# Patient Record
Sex: Female | Born: 1970 | Hispanic: Yes | Marital: Married | Smoking: Never smoker
Health system: Southern US, Community
[De-identification: ages and names within clinical notes are randomized; demographics above are authoritative.]

---

## 1998-12-23 HISTORY — PX: BREAST BIOPSY: SHX20

## 2003-12-24 HISTORY — PX: BREAST BIOPSY: SHX20

## 2004-11-07 ENCOUNTER — Ambulatory Visit: Payer: Self-pay | Admitting: General Surgery

## 2005-05-15 ENCOUNTER — Ambulatory Visit: Payer: Self-pay | Admitting: Gastroenterology

## 2006-06-05 ENCOUNTER — Ambulatory Visit: Payer: Self-pay

## 2007-02-27 ENCOUNTER — Ambulatory Visit: Payer: Self-pay | Admitting: General Surgery

## 2007-03-12 ENCOUNTER — Ambulatory Visit: Payer: Self-pay | Admitting: General Surgery

## 2007-04-17 ENCOUNTER — Ambulatory Visit: Payer: Self-pay | Admitting: General Surgery

## 2008-02-05 ENCOUNTER — Ambulatory Visit: Payer: Self-pay | Admitting: General Surgery

## 2010-12-07 ENCOUNTER — Ambulatory Visit: Payer: Self-pay | Admitting: General Surgery

## 2011-12-10 ENCOUNTER — Ambulatory Visit: Payer: Self-pay

## 2012-12-30 ENCOUNTER — Ambulatory Visit: Payer: Self-pay

## 2013-04-23 ENCOUNTER — Ambulatory Visit: Payer: Self-pay

## 2014-11-30 ENCOUNTER — Emergency Department: Payer: Self-pay | Admitting: Emergency Medicine

## 2015-03-27 ENCOUNTER — Ambulatory Visit
Admit: 2015-03-27 | Disposition: A | Discharge status: Home / Self Care | Payer: Self-pay | Attending: Urgent Care | Admitting: Urgent Care

## 2016-03-20 ENCOUNTER — Ambulatory Visit
Admission: RE | Admit: 2016-03-20 | Discharge: 2016-03-20 | Disposition: A | Discharge status: Home / Self Care | Payer: Self-pay | Source: Ambulatory Visit | Attending: Oncology | Admitting: Oncology

## 2016-03-20 ENCOUNTER — Ambulatory Visit: Payer: Self-pay | Attending: Oncology

## 2016-03-20 VITALS — BP 132/81 | HR 65 | Temp 97.2°F | Ht 63.39 in | Wt 147.9 lb

## 2016-03-20 DIAGNOSIS — Z0000 Encounter for general adult medical examination without abnormal findings: Secondary | ICD-10-CM

## 2016-03-20 NOTE — Progress Notes (Signed)
Subjective:     Patient ID: Ane PaymentMaria E Zinni, female   DOB: Mar 05, 1971, 45 y.o.   MRN: [id number]  HPI   Review of Systems     Objective:   Physical Exam  Pulmonary/Chest: Right breast exhibits no inverted nipple, no mass, no nipple discharge, no skin change and no tenderness. Left breast exhibits no inverted nipple, no mass, no nipple discharge, no skin change and no tenderness. Breasts are asymmetrical.    History of benign right breast biopsy       Assessment:     45 [facility]  Patient screened, and meets [facility] eligibility.  Patient does not have insurance, Medicare or Medicaid.  Handout given on Affordable Care Act. Instructed patient on breast self-exam using teach back method. CBE unremarkable.  Left breast 1/2 cup size larger than right as noted on previous exams. Patient reports irregular menstrual cycles, hot flashes, and intermittent bilateral breast tenderness, greater on left side.  Has historically had left breast tenderness, with normal mammograms, and ultrasounds. No mass or lump palpated. Given literature on Menopause, and Help for Breast Pain.  Loyda Murr interpreted exam.    Plan:    Previous mammogram showed homogenously dense breast tissue. Sent for screening bilateral tomosynthesis  mammogram.

## 2016-03-21 NOTE — Progress Notes (Signed)
Letter mailed from [facility] to notify of normal mammogram results.  Patient to return in one year for annual screening.  Copy to [facility]. 

## 2017-03-19 ENCOUNTER — Other Ambulatory Visit: Payer: Self-pay | Admitting: Family Medicine

## 2017-03-19 DIAGNOSIS — Z0000 Encounter for general adult medical examination without abnormal findings: Secondary | ICD-10-CM

## 2017-04-18 ENCOUNTER — Ambulatory Visit
Admission: RE | Admit: 2017-04-18 | Discharge: 2017-04-18 | Disposition: A | Discharge status: Home / Self Care | Payer: BLUE CROSS/BLUE SHIELD | Source: Ambulatory Visit | Attending: Family Medicine | Admitting: Family Medicine

## 2017-04-18 DIAGNOSIS — Z1231 Encounter for screening mammogram for malignant neoplasm of breast: Secondary | ICD-10-CM | POA: Diagnosis not present

## 2017-04-18 DIAGNOSIS — Z0000 Encounter for general adult medical examination without abnormal findings: Secondary | ICD-10-CM

## 2018-06-11 ENCOUNTER — Other Ambulatory Visit: Payer: Self-pay | Admitting: Family Medicine

## 2018-06-11 DIAGNOSIS — Z1231 Encounter for screening mammogram for malignant neoplasm of breast: Secondary | ICD-10-CM

## 2018-07-01 ENCOUNTER — Ambulatory Visit
Admission: RE | Admit: 2018-07-01 | Discharge: 2018-07-01 | Disposition: A | Discharge status: Home / Self Care | Payer: BLUE CROSS/BLUE SHIELD | Source: Ambulatory Visit | Attending: Family Medicine | Admitting: Family Medicine

## 2018-07-01 DIAGNOSIS — Z1231 Encounter for screening mammogram for malignant neoplasm of breast: Secondary | ICD-10-CM | POA: Insufficient documentation

## 2019-09-30 ENCOUNTER — Ambulatory Visit
Admission: RE | Admit: 2019-09-30 | Discharge: 2019-09-30 | Disposition: A | Discharge status: Home / Self Care | Payer: BLUE CROSS/BLUE SHIELD | Source: Ambulatory Visit | Attending: Otolaryngology | Admitting: Otolaryngology

## 2019-09-30 ENCOUNTER — Other Ambulatory Visit (HOSPITAL_COMMUNITY): Payer: Self-pay | Admitting: Otolaryngology

## 2019-09-30 ENCOUNTER — Other Ambulatory Visit: Payer: Self-pay

## 2019-09-30 ENCOUNTER — Other Ambulatory Visit: Payer: Self-pay | Admitting: Otolaryngology

## 2019-09-30 DIAGNOSIS — R1310 Dysphagia, unspecified: Secondary | ICD-10-CM | POA: Diagnosis present

## 2019-09-30 MED ORDER — IOHEXOL 300 MG/ML  SOLN
75.0000 mL | Freq: Once | INTRAMUSCULAR | Status: AC | PRN
  Administered 2019-09-30: 75 mL via INTRAVENOUS

## 2019-10-04 ENCOUNTER — Other Ambulatory Visit: Payer: Self-pay | Admitting: Otolaryngology

## 2019-10-04 DIAGNOSIS — R070 Pain in throat: Secondary | ICD-10-CM

## 2019-10-04 DIAGNOSIS — R1310 Dysphagia, unspecified: Secondary | ICD-10-CM

## 2019-10-27 ENCOUNTER — Other Ambulatory Visit: Payer: Self-pay | Admitting: Family Medicine

## 2019-10-27 DIAGNOSIS — Z1231 Encounter for screening mammogram for malignant neoplasm of breast: Secondary | ICD-10-CM

## 2019-11-04 ENCOUNTER — Ambulatory Visit
Admission: RE | Admit: 2019-11-04 | Discharge: 2019-11-04 | Disposition: A | Discharge status: Home / Self Care | Payer: BLUE CROSS/BLUE SHIELD | Source: Ambulatory Visit | Attending: Otolaryngology | Admitting: Otolaryngology

## 2019-11-04 ENCOUNTER — Other Ambulatory Visit: Payer: Self-pay

## 2019-11-04 DIAGNOSIS — R070 Pain in throat: Secondary | ICD-10-CM | POA: Insufficient documentation

## 2019-11-04 DIAGNOSIS — R1310 Dysphagia, unspecified: Secondary | ICD-10-CM | POA: Insufficient documentation

## 2019-11-04 MED ORDER — IOHEXOL 300 MG/ML  SOLN
75.0000 mL | Freq: Once | INTRAMUSCULAR | Status: AC | PRN
  Administered 2019-11-04: 75 mL via INTRAVENOUS

## 2020-12-18 ENCOUNTER — Other Ambulatory Visit: Payer: BLUE CROSS/BLUE SHIELD

## 2020-12-18 DIAGNOSIS — Z20822 Contact with and (suspected) exposure to covid-19: Secondary | ICD-10-CM

## 2020-12-20 LAB — NOVEL CORONAVIRUS, NAA: SARS-CoV-2, NAA: NOT DETECTED

## 2020-12-20 LAB — SARS-COV-2, NAA 2 DAY TAT

## 2021-02-08 IMAGING — CT CT NECK W/ CM
4 of 5 series · 13 of 33 positions shown, 15 images · IV contrast (omnipaque)
Comparison: Neck CT 09/30/2019

CLINICAL DATA: Throat pain. Dysphagia, unspecified type. Additional
history provided: Itching, ear pain in both ears and sore throat for
past 3 months. Patient reportedly

EXAM:
CT NECK WITH CONTRAST
TECHNIQUE: Multidetector CT imaging of the neck was performed using the
standard protocol following the bolus administration of intravenous
contrast.
CONTRAST:  75mL OMNIPAQUE IOHEXOL 300 MG/ML  SOLN

[Series 2: axial neck neck (person_name) 2.00 · axial · 0.60mm/px · z∈[-701,-639]mm · 2 of 124 slices shown]
[im 31/124  bone]
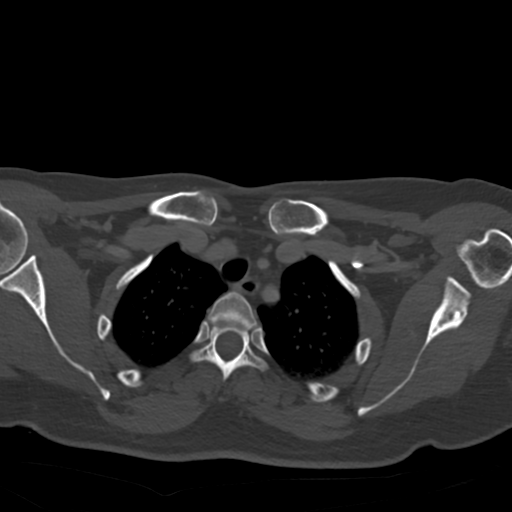
[im 62/124  bone]
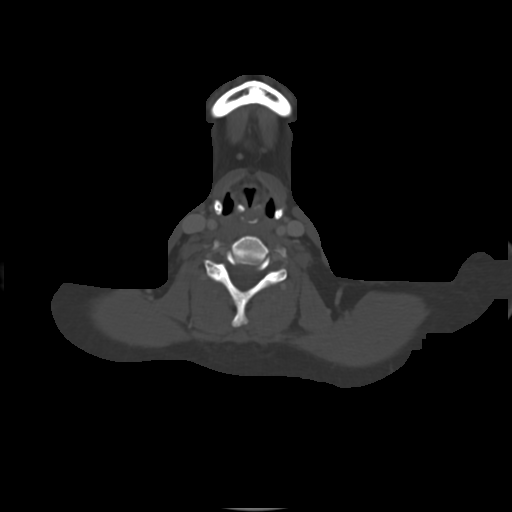

[Series 4: coronal neck neck (person_name) 2.00 cor · coronal · 0.41mm/px · 3 of 141 slices shown]
[im 45/141  bone]
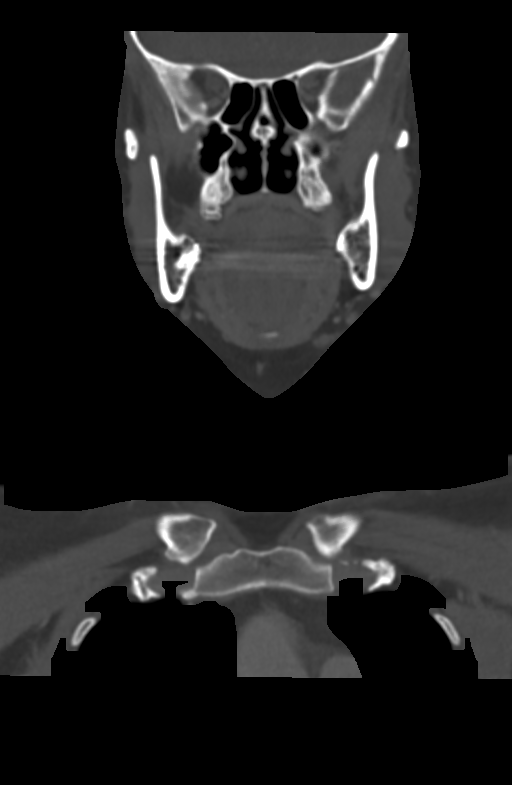
[im 62/141  bone]
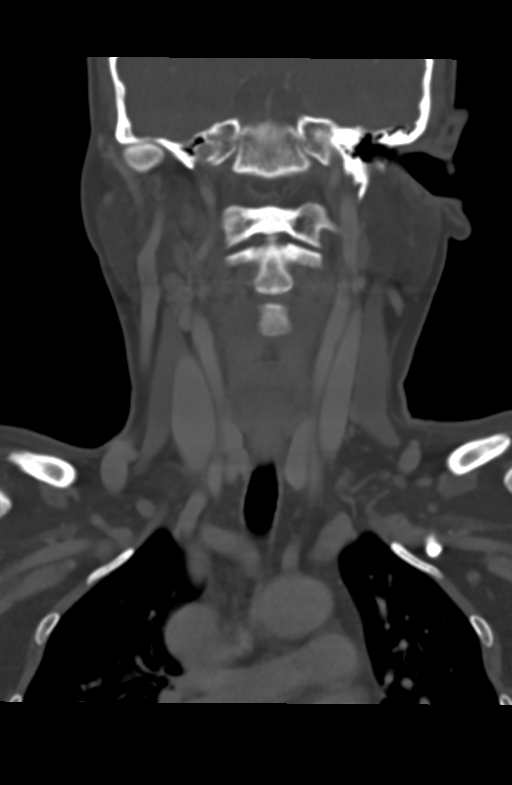
[im 79/141  bone]
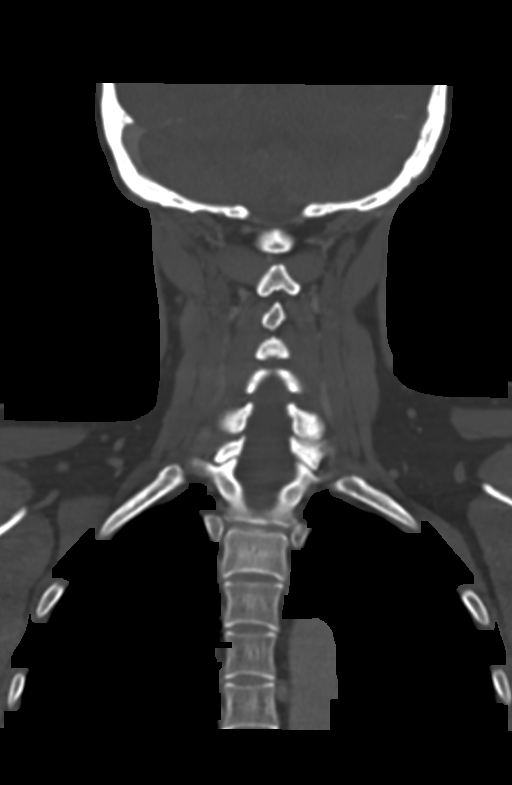

[Series 6: sagittal neck neck (person_name) 2.00 sag · sagittal · 0.55mm/px · 5 of 103 slices shown, 6 images]
[im 35/103  bone]
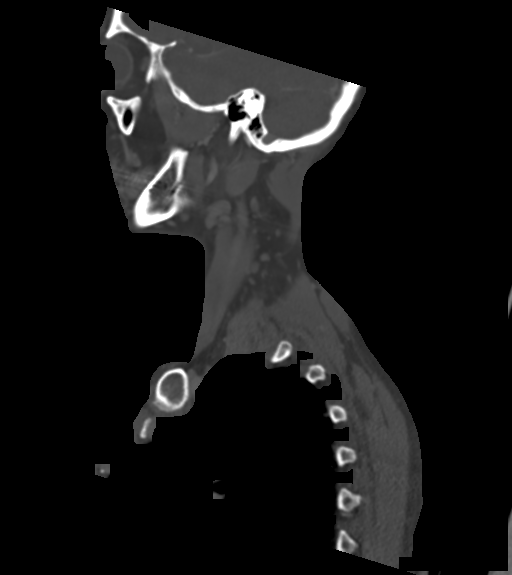
[im 43/103  bone]
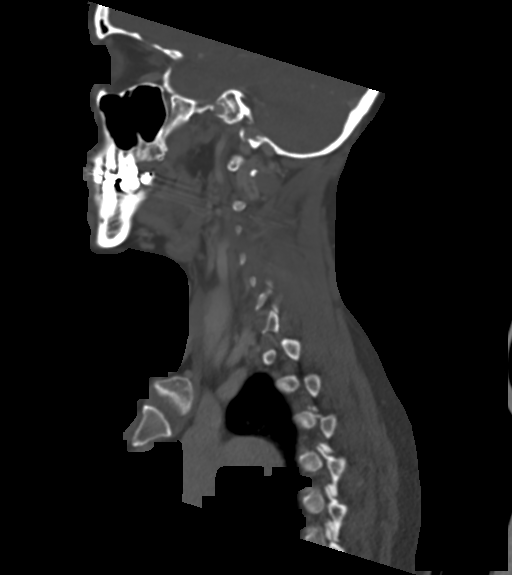
[im 52/103  soft-tissue]
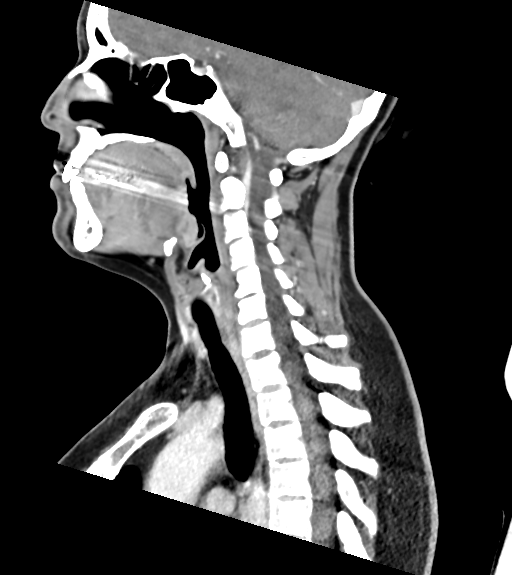
[im 52/103  bone]
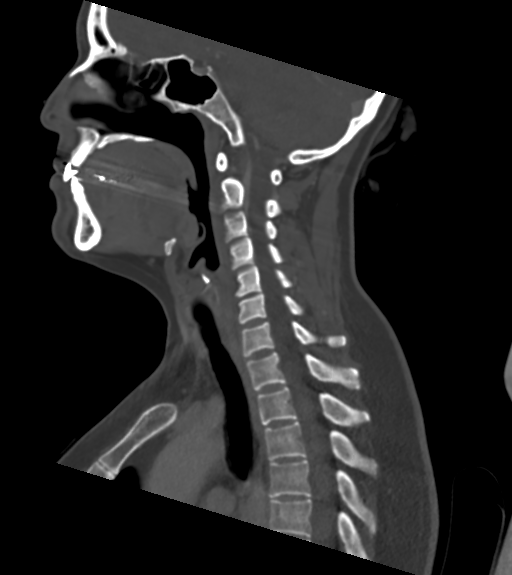
[im 60/103  bone]
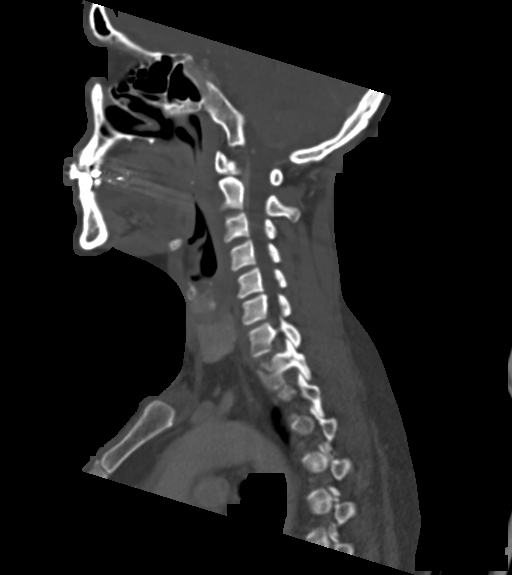
[im 69/103  bone]
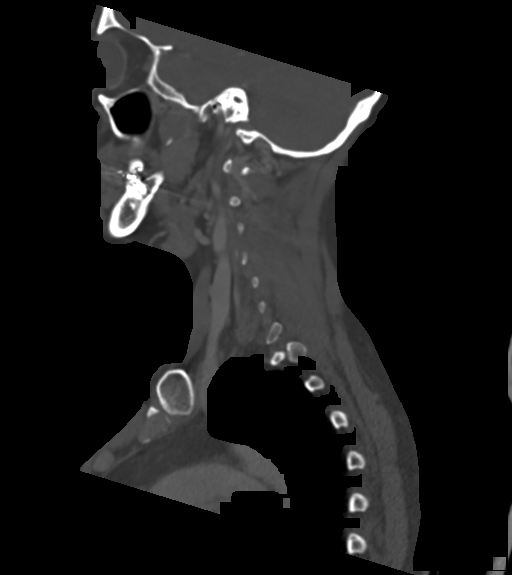

[Series 8: ax oropharynx neck neck (person_name) 2.00 ax · axial · 0.41mm/px · z∈[-756,-605]mm · 3 of 159 slices shown, 4 images]
[im 40/159  soft-tissue]
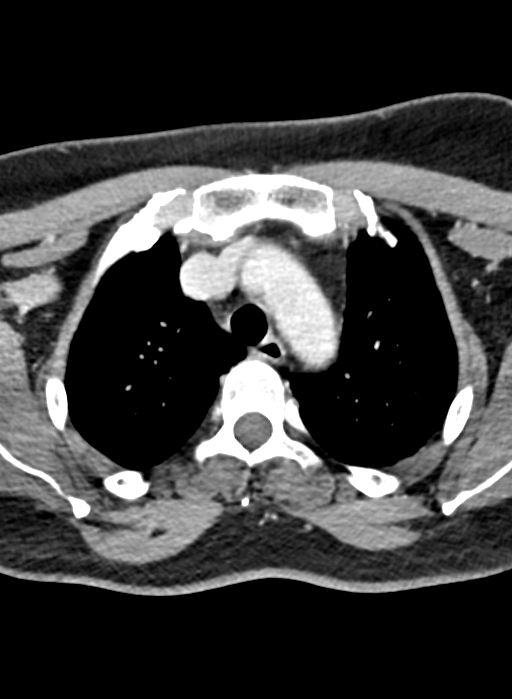
[im 40/159  bone]
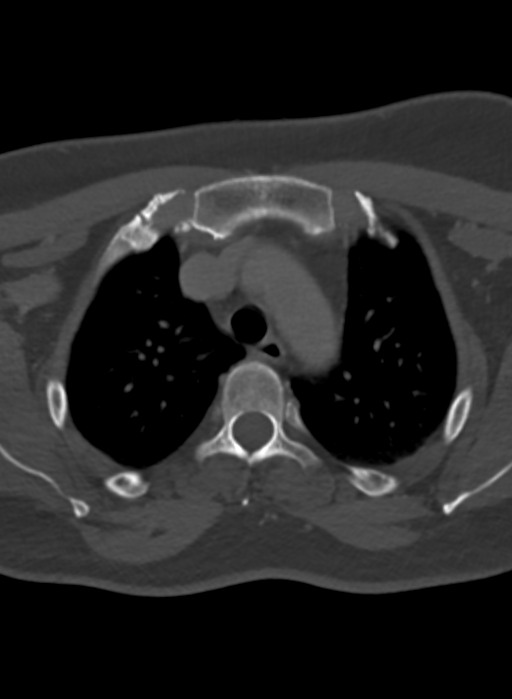
[im 80/159  bone]
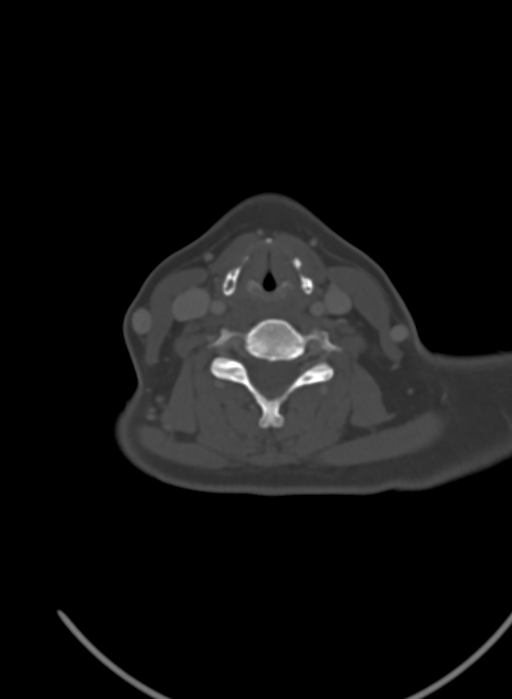
[im 119/159  bone]
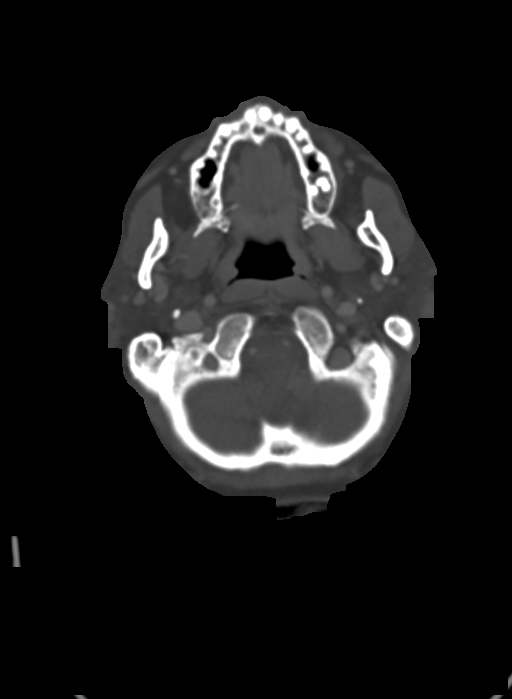

[13 of 33 positions shown; findings below may reference images not displayed]

FINDINGS: Pharynx and larynx: No appreciable swelling or discrete mass within
the oral cavity, pharynx or larynx. Redemonstrated postinflammatory
bilateral tonsillar calcifications.

Salivary glands: No evidence of inflammation, mass or stone.

Thyroid: Negative

Lymph nodes: No pathologically enlarged cervical chain lymph nodes.

Vascular: The major vascular structures of the neck appear patent.

Limited intracranial: No abnormality identified

Visualized orbits: Visualized orbits demonstrate no acute
abnormality.

Mastoids and visualized paranasal sinuses: No significant paranasal
sinus disease or mastoid effusion at the imaged levels.

Skeleton: No acute bony abnormality or suspicious osseous lesion.

Upper chest: No consolidation within the imaged lung apices.
Redemonstrated prominent azygos venous arch.

Other: Again demonstrated are two subcentimeter cystic appearing
foci above and below the hyoid bone (series 6, image 55). However,
the previously demonstrated peripheral enhancement and subtle
surrounding inflammatory changes are no longer present.
IMPRESSION: Redemonstrated are two subcentimeter cystic appearing foci above and
below the hyoid bone, likely reflecting a bilobed thyroglossal duct
cyst. Previously demonstrated peripheral enhancement and subtle
surrounding inflammatory changes have resolved, consistent with
interval resolution of superinfection.

## 2021-05-14 ENCOUNTER — Other Ambulatory Visit: Payer: Self-pay | Admitting: Family Medicine

## 2021-05-14 DIAGNOSIS — Z1231 Encounter for screening mammogram for malignant neoplasm of breast: Secondary | ICD-10-CM

## 2023-06-24 ENCOUNTER — Other Ambulatory Visit: Payer: Self-pay | Admitting: Family Medicine

## 2023-06-24 DIAGNOSIS — Z1231 Encounter for screening mammogram for malignant neoplasm of breast: Secondary | ICD-10-CM

## 2024-09-27 ENCOUNTER — Other Ambulatory Visit: Payer: Self-pay | Admitting: Family Medicine

## 2024-09-27 DIAGNOSIS — Z1231 Encounter for screening mammogram for malignant neoplasm of breast: Secondary | ICD-10-CM

## 2024-11-08 ENCOUNTER — Ambulatory Visit
Admission: RE | Admit: 2024-11-08 | Discharge: 2024-11-08 | Disposition: A | Discharge status: Home / Self Care | Source: Ambulatory Visit | Attending: Family Medicine | Admitting: Family Medicine

## 2024-11-08 DIAGNOSIS — Z1231 Encounter for screening mammogram for malignant neoplasm of breast: Secondary | ICD-10-CM | POA: Diagnosis present

## 2024-11-09 ENCOUNTER — Inpatient Hospital Stay
Admission: RE | Admit: 2024-11-09 | Discharge: 2024-11-09 | Disposition: A | Payer: Self-pay | Source: Ambulatory Visit | Attending: Family Medicine | Admitting: Family Medicine

## 2024-11-09 ENCOUNTER — Other Ambulatory Visit: Payer: Self-pay | Admitting: *Deleted

## 2024-11-09 ENCOUNTER — Inpatient Hospital Stay
Admission: RE | Admit: 2024-11-09 | Discharge: 2024-11-09 | Disposition: A | Discharge status: Home / Self Care | Payer: Self-pay | Source: Ambulatory Visit | Attending: Family Medicine | Admitting: Family Medicine

## 2024-11-09 DIAGNOSIS — Z1231 Encounter for screening mammogram for malignant neoplasm of breast: Secondary | ICD-10-CM

## 2024-12-14 NOTE — Progress Notes (Deleted)
 Sleep Medicine   Office Visit  Patient Name: Jane Pitts DOB: 1971/03/18 MRN [id number]    Chief Complaint: ***  Brief History:  Lawren presents for an initial consult for sleep evaluation and to establish care. Patient has a *** history of ***. Sleep quality is ***. This is noted *** nights. The patient's bed partner reports *** at night. The patient relates the following symptoms: *** are also present. The patient goes to sleep at *** and wakes up at ***. Patient reports waking up at least *** times in between and has *** trouble returning to sleep. Sleep quality is *** when outside home environment.  Patient has noted *** of her legs at night that would disrupt her sleep.  The patient reports *** as unusual behavior during the night.  The patient reports *** as a history of psychiatric problems. The Epworth Sleepiness Score is *** out of 24.  The patient reports cardiovascular risk factors as following: ***. The patient reports ***.    ROS  General: (-) fever, (-) chills, (-) night sweat Nose and Sinuses: (-) nasal stuffiness or itchiness, (-) postnasal drip, (-) nosebleeds, (-) sinus trouble. Mouth and Throat: (-) sore throat, (-) hoarseness. Neck: (-) swollen glands, (-) enlarged thyroid, (-) neck pain. Respiratory: *** cough, *** shortness of breath, *** wheezing. Neurologic: *** numbness, *** tingling. Psychiatric: *** anxiety, *** depression Sleep behavior: ***sleep paralysis ***hypnogogic hallucinations ***dream enactment      ***vivid dreams ***cataplexy ***night terrors ***sleep walking   Current Medication: No outpatient encounter medications on file as of 12/20/2024.   No facility-administered encounter medications on file as of 12/20/2024.    Surgical History: Past Surgical History:  Procedure Laterality Date   BREAST BIOPSY Left 2005   BREAST BIOPSY Right 2000    Medical History: No past medical history on file.  Family History: Non contributory to the  present illness  Social History: Social History   Socioeconomic History   Marital status: Married    Spouse name: Not on file   Number of children: Not on file   Years of education: Not on file   Highest education level: Not on file  Occupational History   Not on file  Tobacco Use   Smoking status: Never   Smokeless tobacco: Not on file  Substance and Sexual Activity   Alcohol use: Not on file   Drug use: Not on file   Sexual activity: Not on file  Other Topics Concern   Not on file  Social History Narrative   Not on file   Social Drivers of Health   Tobacco Use: Low Risk  (11/26/2024)   Received from [facility]   Patient History    Smoking Tobacco Use: Never    Smokeless Tobacco Use: Never    Passive Exposure: Never  Financial Resource Strain: Not on file  Food Insecurity: Not on file  Transportation Needs: Not on file  Physical Activity: Not on file  Stress: Not on file  Social Connections: Not on file  Intimate Partner Violence: Not on file  Depression (EYV7-0): Not on file  Alcohol Screen: Not on file  Housing: Not on file  Utilities: Not on file  Health Literacy: Not on file    Vital Signs: There were no vitals taken for this visit. There is no height or weight on file to calculate BMI.   Examination: General Appearance: The patient is well-developed, well-nourished, and in no distress. Neck Circumference: *** Skin: Gross inspection of skin unremarkable. Head:  normocephalic, no gross deformities. Eyes: no gross deformities noted. ENT: ears appear grossly normal Neurologic: Alert and oriented. No involuntary movements.    STOP BANG RISK ASSESSMENT S (snore) Have you been told that you snore?     YES/N   T (tired) Are you often tired, fatigued, or sleepy during the day?   YES/NO  O (obstruction) Do you stop breathing, choke, or gasp during sleep? YES/NO   P (pressure) Do you have or are you being treated for high blood pressure? YES/NO   B (BMI)  Is your body index greater than 35 kg/m? YES/NO   A (age) Are you 36 years old or older? YES   N (neck) Do you have a neck circumference greater than 16 inches?   YES/NO   G (gender) Are you a female? NO   TOTAL STOP/BANG YES ANSWERS                                                                A STOP-Bang score of 2 or less is considered low risk, and a score of 5 or more is high risk for having either moderate or severe OSA. For people who score 3 or 4, doctors may need to perform further assessment to determine how likely they are to have OSA.         EPWORTH SLEEPINESS SCALE:  Scale:  (0)= no chance of dozing; (1)= slight chance of dozing; (2)= moderate chance of dozing; (3)= high chance of dozing  Chance  Situtation    Sitting and reading: ***    Watching TV: ***    Sitting Inactive in public: ***    As a passenger in car: ***      Lying down to rest: ***    Sitting and talking: ***    Sitting quielty after lunch: ***    In a car, stopped in traffic: ***   TOTAL SCORE:   *** out of 24    SLEEP STUDIES:  None   LABS: No results found for this or any previous visit (from the past 2160 hours).  Radiology: [facility] Mammo Result Date: 11/09/2024 This examination belongs to an outside facility and is stored here for comparison purposes only.  Contact the originating outside institution for any associated report or interpretation.  [facility] Mammo Result Date: 11/09/2024 This examination belongs to an outside facility and is stored here for comparison purposes only.  Contact the originating outside institution for any associated report or interpretation.  MM Outside Films Mammo Result Date: 11/09/2024 This examination belongs to an outside facility and is stored here for comparison purposes only.  Contact the originating outside institution for any associated report or interpretation.  MM Outside Films Mammo Result Date: 11/09/2024 This  examination belongs to an outside facility and is stored here for comparison purposes only.  Contact the originating outside institution for any associated report or interpretation.   No results found.  No results found.    Assessment and Plan: There are no active problems to display for this patient.    PLAN OSA:   Patient evaluation suggests high risk of sleep disordered breathing due to *** Patient has comorbid cardiovascular risk factors including: *** which could be exacerbated by pathologic sleep-disordered breathing.  Suggest: *** to assess/treat the patient's sleep disordered breathing. The patient was also counselled on *** to optimize sleep health.  PLAN hypersomnia:  Patient evaluation suggests significant daytime hypersomnia.  The Epworth Sleepiness Score is elevated at *** out of 24. Patient *** drowsy driving. The patient *** MVA due to sleepiness.  The patient *** restless leg symptoms which exacerbate *** for *** nights per week. The patient *** periodic limb movements which exacerbate ***  for *** nights per week. Suggest: ***  Also suggest ***  PLAN insomnia:  Patient evaluation suggests *** insomnia. This is a chronic disorder. This has been a concern for *** and causes impaired daytime functioning. The patient exhibits comorbid ***  The history *** suggest the insomnia predates the use of hypnotic medications. The symptoms *** with the discontinuation of these medications. There is no obvious medical, psychiatric or pharmacologic abuse issues ot account for the insomnia.  Treatment recommendations include: *** The patient should maintain a sleep log and calculate total sleep time for 1-2 weeks. Set bed and wake times for achieve 85% sleep efficiency for one week. Once this is achieved  time in bed can be gradually increased. A pharmacologic treatment approach would include a trial of *** for the next ***  months. During this time the patient is to maintain a sleep  diary to track progress.    ***  General Counseling: I have discussed the findings of the evaluation and examination with Hadassah.  I have also discussed any further diagnostic evaluation thatmay be needed or ordered today. Rochanda verbalizes understanding of the findings of todays visit. We also reviewed her medications today and discussed drug interactions and side effects including but not limited excessive drowsiness and altered mental states. We also discussed that there is always a risk not just to her but also people around her. she has been encouraged to call the office with any questions or concerns that should arise related to todays visit.  No orders of the defined types were placed in this encounter.       I have personally obtained a history, evaluated the patient, evaluated pertinent data, formulated the assessment and plan and placed orders.    Elfreda DELENA Bathe, MD [facility] Diplomate ABMS Pulmonary and Critical Care Medicine Sleep medicine

## 2024-12-20 ENCOUNTER — Ambulatory Visit: Payer: Self-pay
